# Patient Record
Sex: Male | Born: 1990 | Race: Black or African American | Hispanic: No | Marital: Single | Smoking: Never smoker
Health system: Southern US, Community
[De-identification: ages and names within clinical notes are randomized; demographics above are authoritative.]

## PROBLEM LIST (undated history)

## (undated) ENCOUNTER — Emergency Department (HOSPITAL_COMMUNITY): Admission: EM | Payer: Self-pay | Source: Home / Self Care

---
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
## 2009-01-28 IMAGING — CR DG ANKLE 2V *L*
1 series · 2 of 2 positions shown · non-contrast
Comparison: none

REASON FOR EXAM: inj  ent
COMMENTS:   LMP: (Male)

PROCEDURE:     DXR - DXR ANKLE LEFT AP AND LATERAL  - October 06, 2007  [DATE]
RESULT:     Images of the left ankle demonstrate no fracture, dislocation or
radiopaque foreign body. There does appear to be some mild soft tissue
swelling medially.

[Series 1: view not recorded · 0.17mm/px · 2 of 2 slices shown]
[im 1/2]
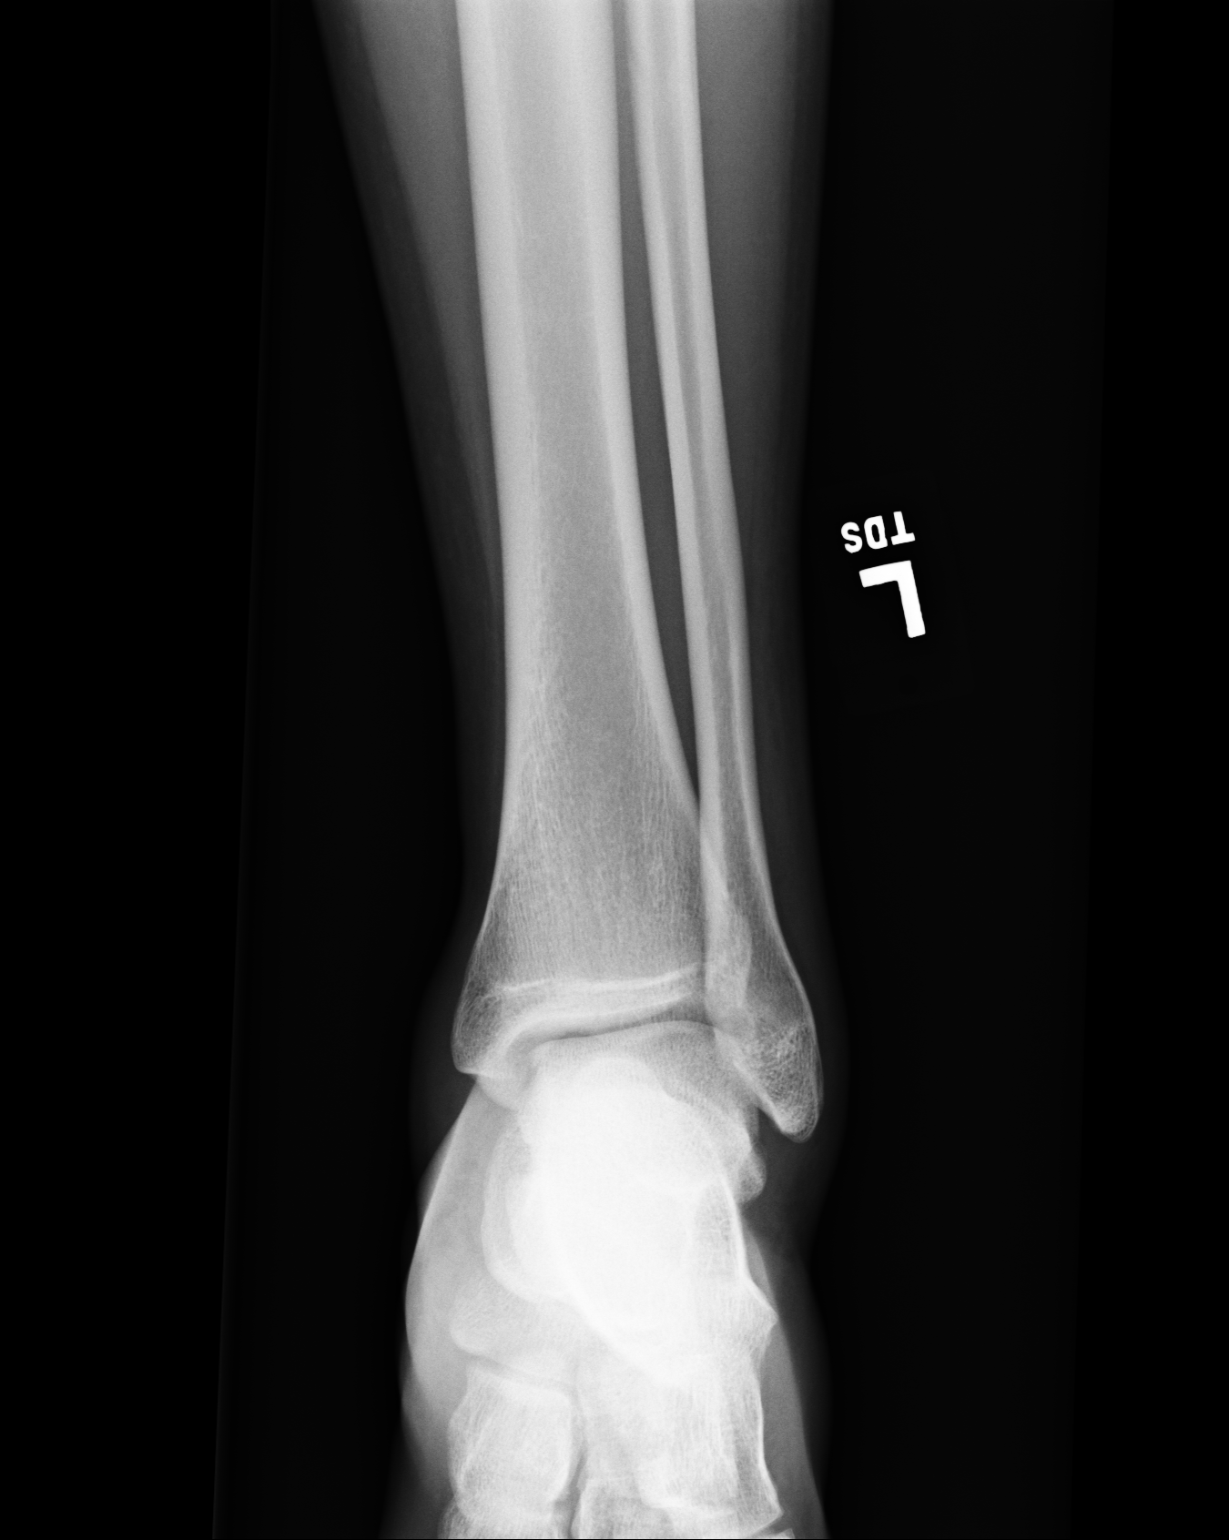
[im 2/2]
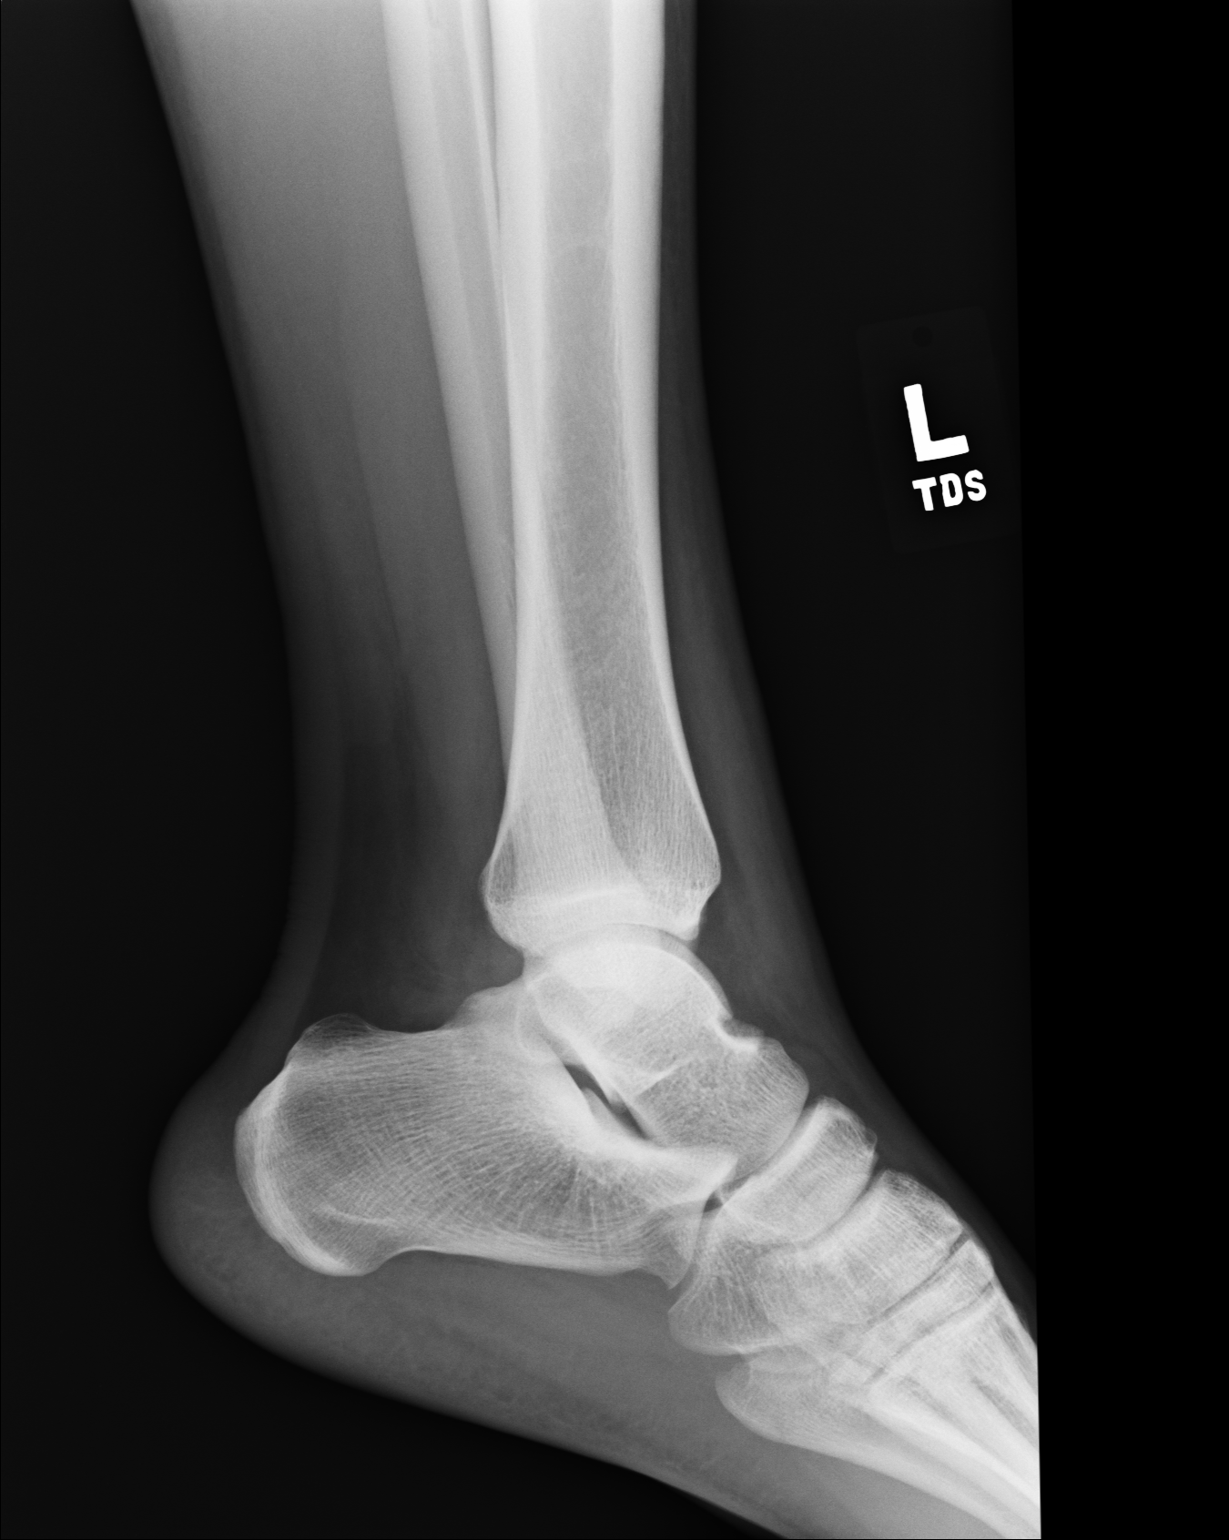

[2 of 2 positions shown; findings below may reference images not displayed]

IMPRESSION: No acute bony abnormality evident on these 2 views.

## 2009-01-28 IMAGING — CR DG FOOT 2V*L*
1 series · 2 of 2 positions shown · non-contrast
Comparison: none

REASON FOR EXAM: injury
COMMENTS:

PROCEDURE:     DXR - DXR FOOT LEFT AP AND LATERAL  - October 06, 2007  [DATE]
RESULT:     Images of the left foot demonstrate no fracture, dislocation or
radiopaque foreign body. If the patient has persistent symptoms, followup
images would be suggested in 7 to 10 days.

[Series 1: view not recorded · 0.17mm/px · 2 of 2 slices shown]
[im 1/2]
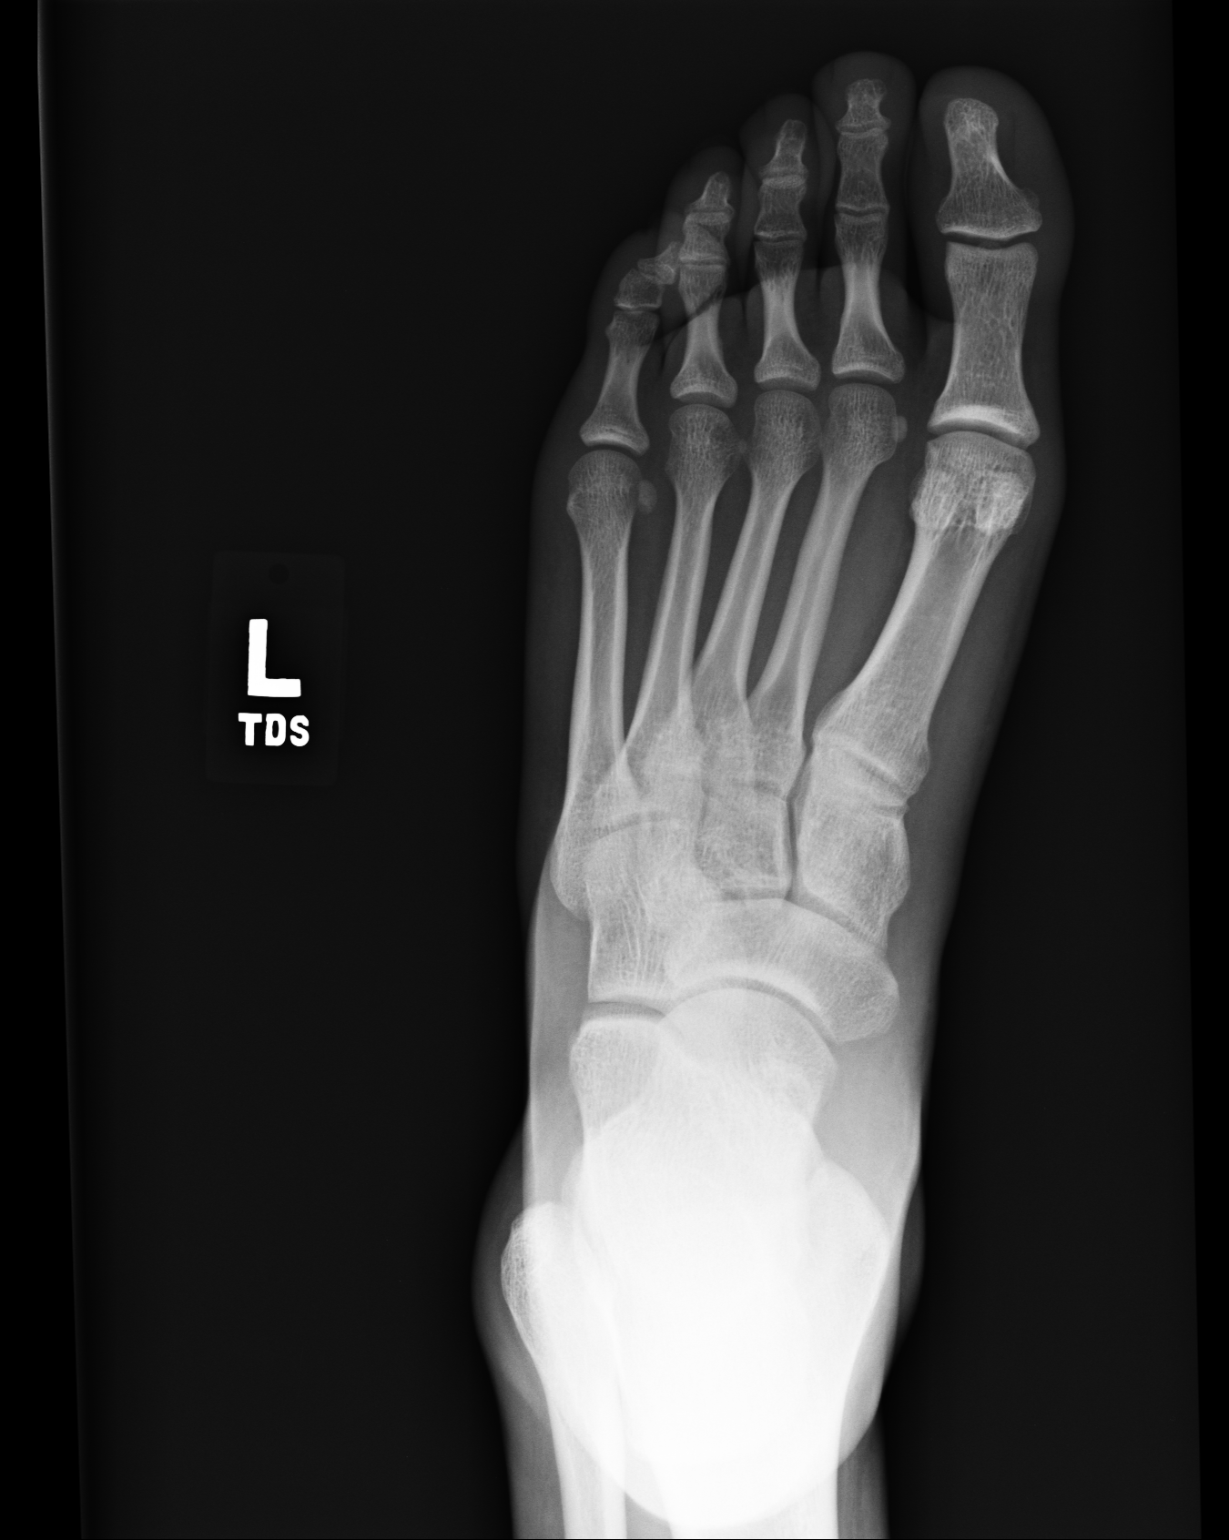
[im 2/2]
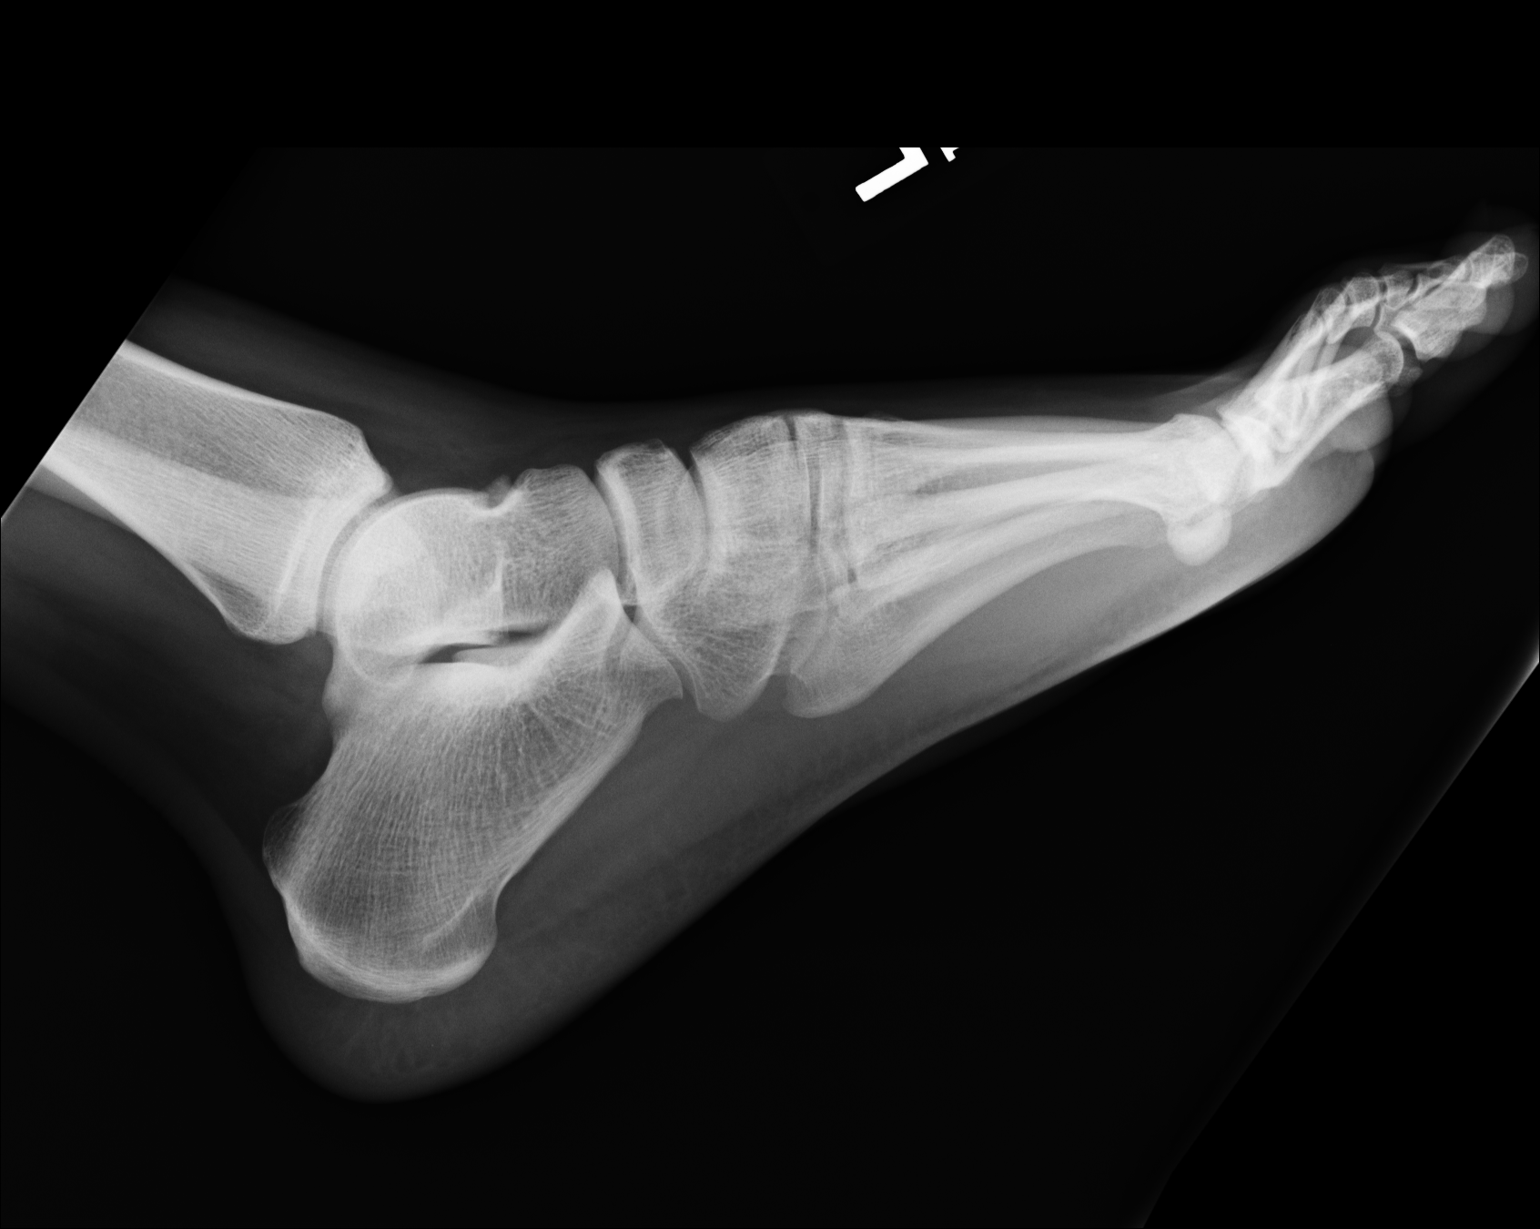

[2 of 2 positions shown; findings below may reference images not displayed]

IMPRESSION: No acute bony abnormality demonstrated.

## 2014-04-12 ENCOUNTER — Emergency Department: Payer: Self-pay | Admitting: Emergency Medicine

## 2014-04-16 ENCOUNTER — Emergency Department: Payer: Self-pay | Admitting: Internal Medicine

## 2014-04-23 ENCOUNTER — Emergency Department: Payer: Self-pay | Admitting: Emergency Medicine

## 2014-12-12 ENCOUNTER — Emergency Department: Payer: Self-pay | Admitting: Student

## 2015-08-05 IMAGING — CR RIGHT LITTLE FINGER 2+V
1 series · 3 of 3 positions shown · non-contrast
Comparison: None.

CLINICAL DATA: Post reduction.

EXAM:
RIGHT LITTLE FINGER 2+V

[Series 1: pa · 0.17mm/px · 3 of 3 slices shown]
[im 1/3]
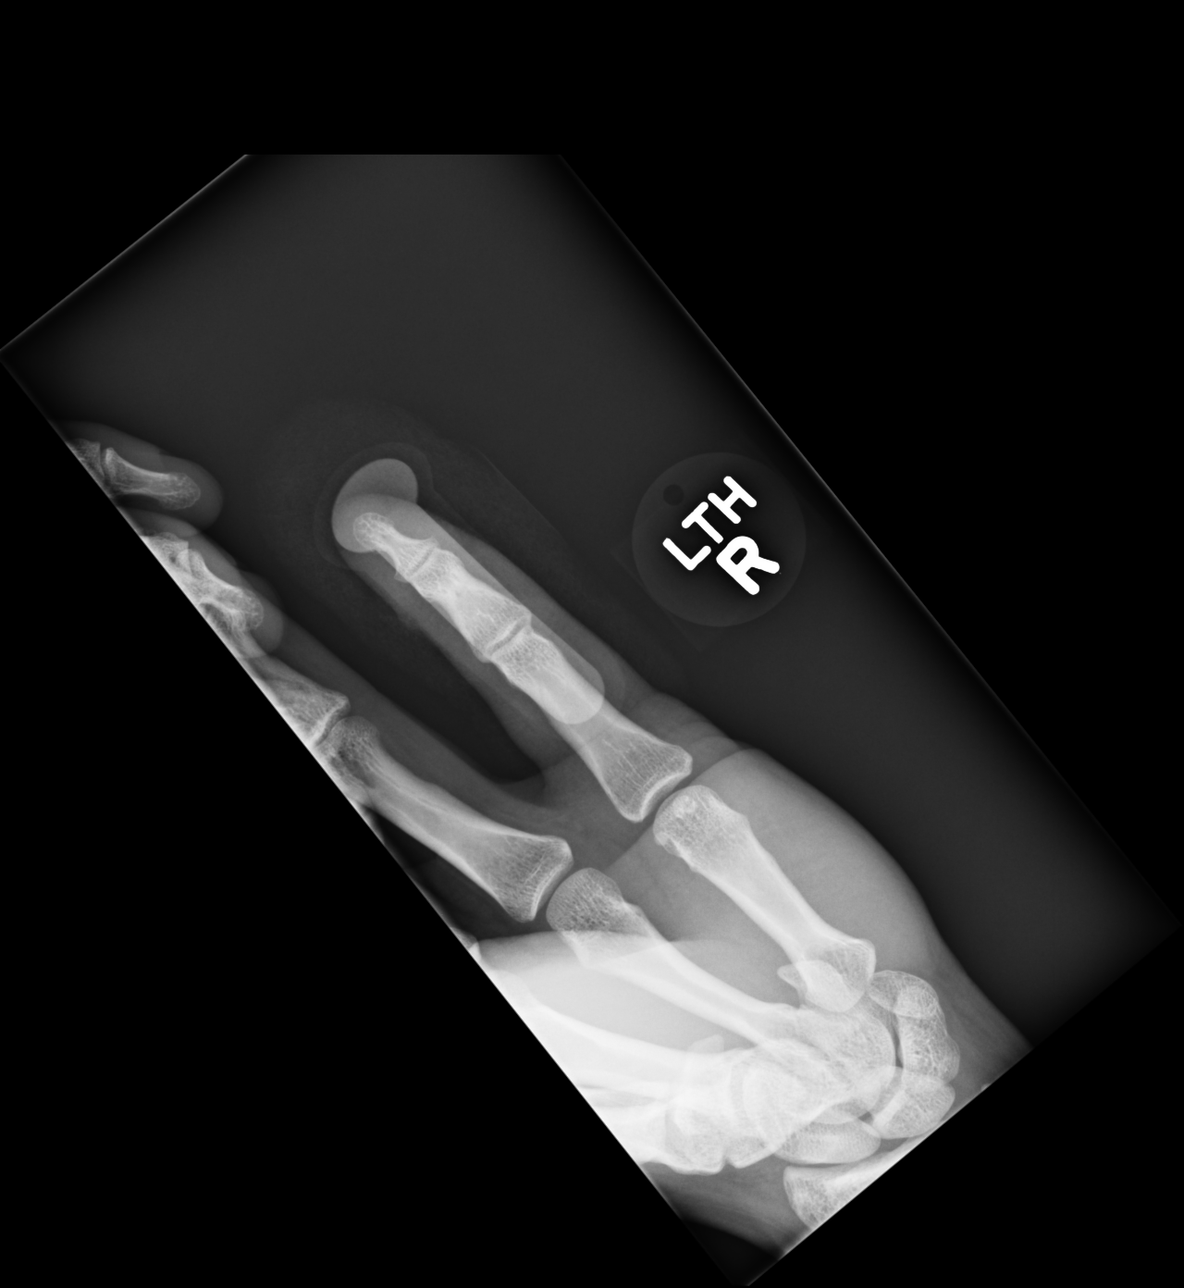
[im 2/3]
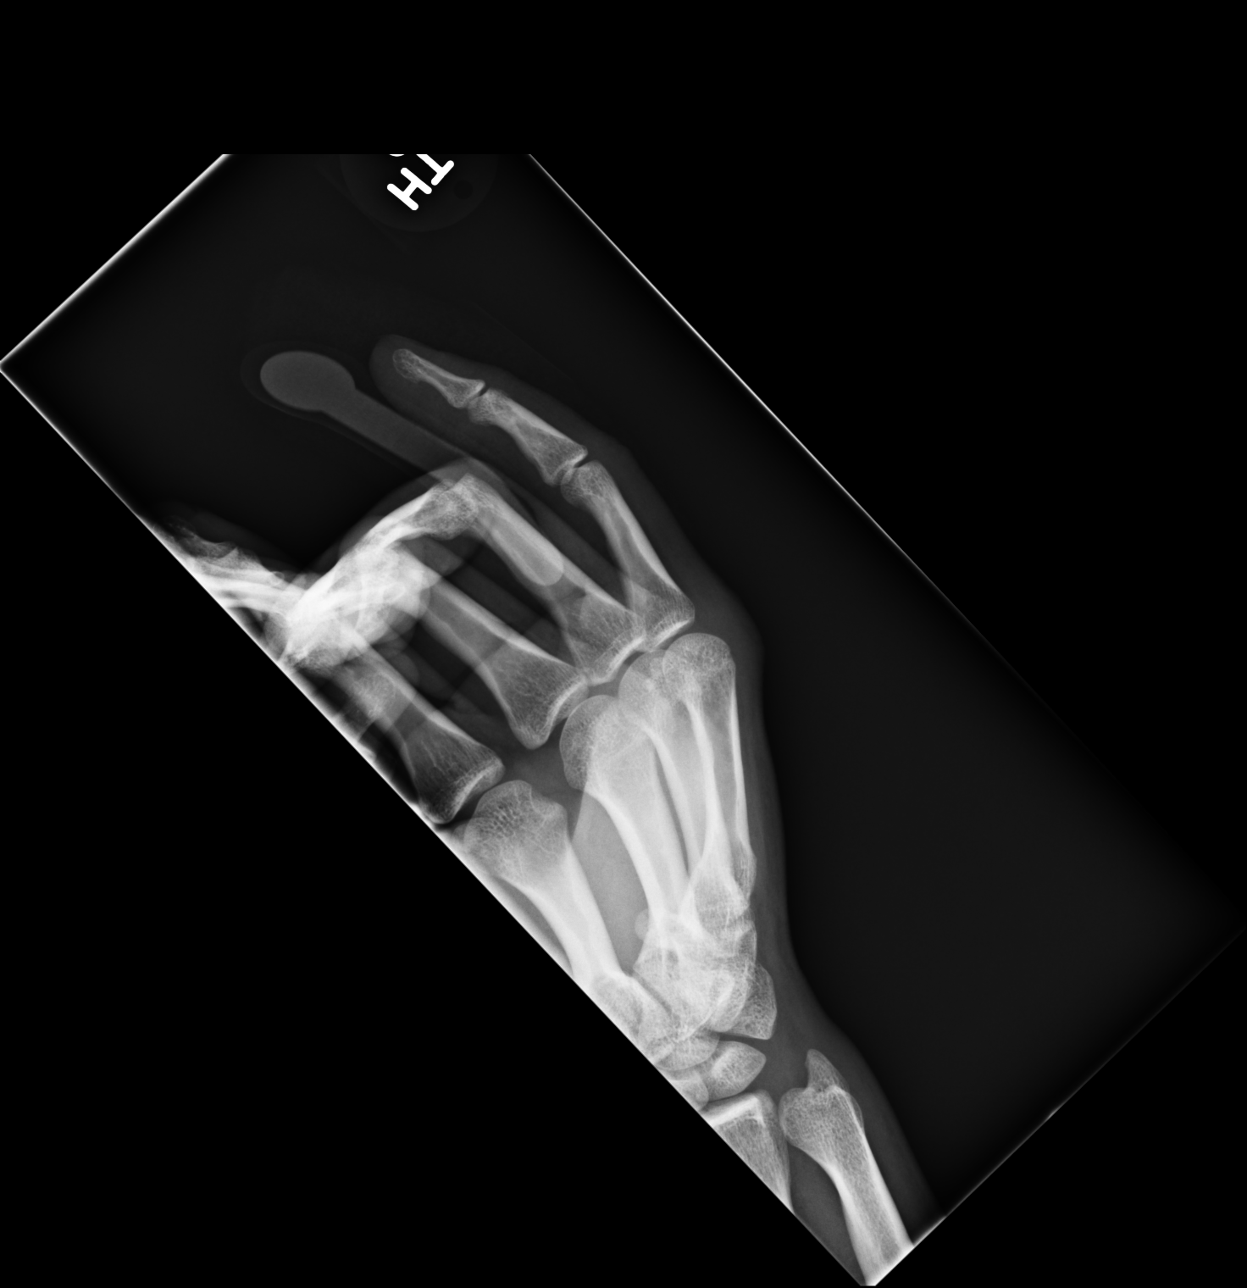
[im 3/3]
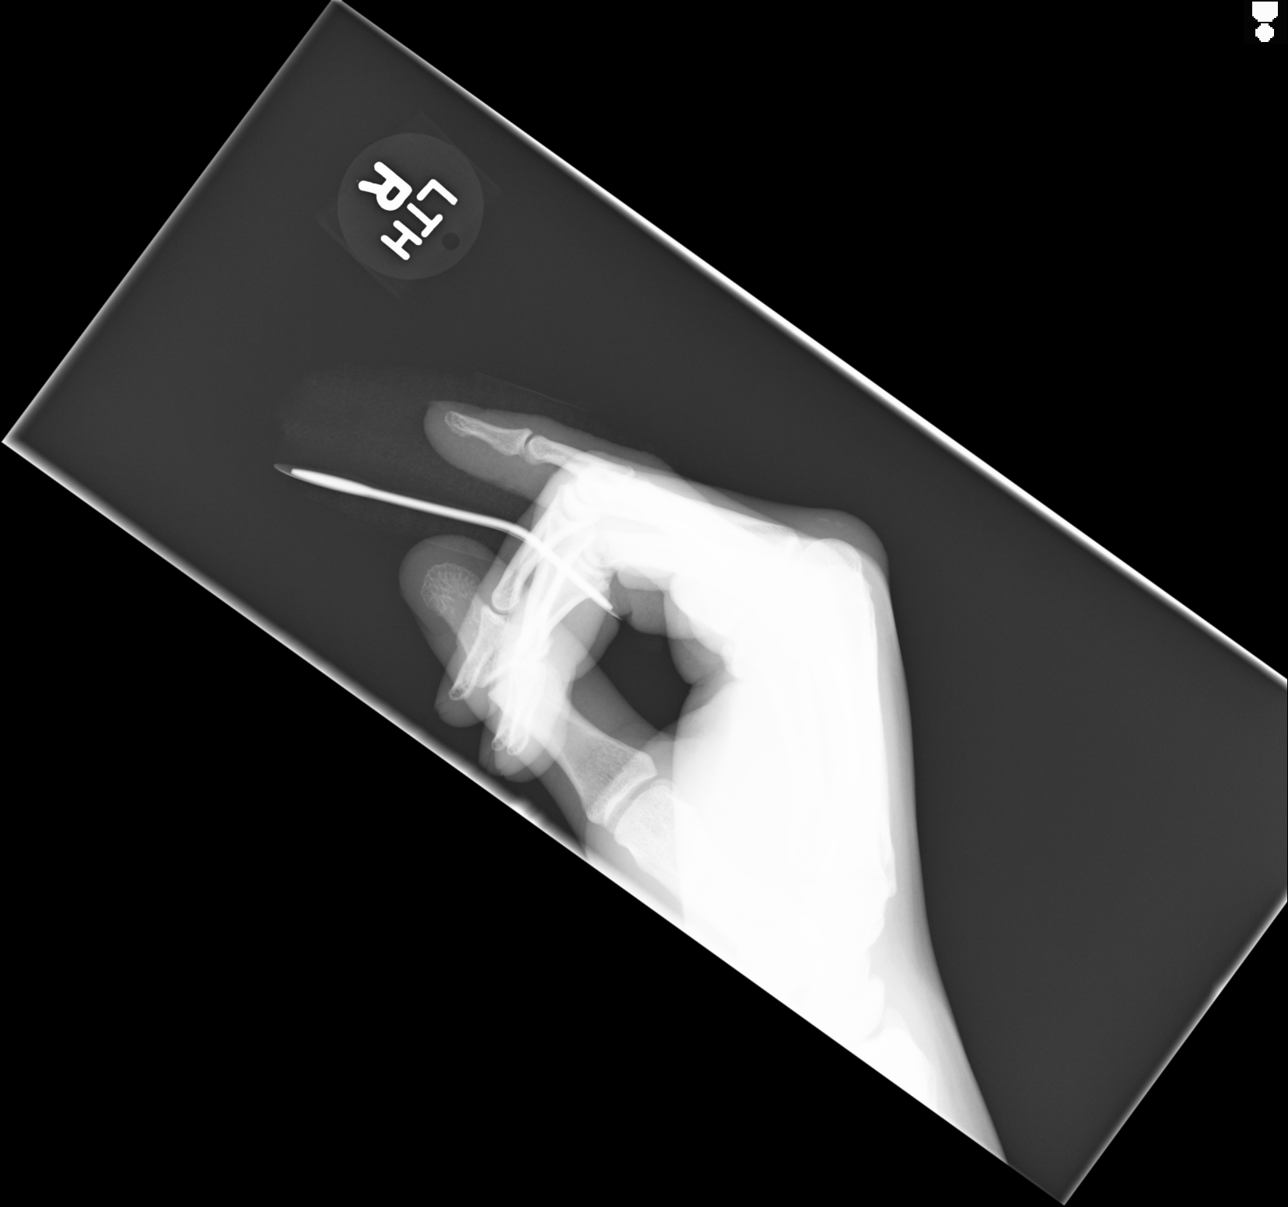

[3 of 3 positions shown; findings below may reference images not displayed]

FINDINGS: The previously identified fifth finger DIP joint dislocation has
been reduced. No visible fracture. A splint has been placed over the
volar aspect.
IMPRESSION: Satisfactory postreduction appearance.

## 2021-06-27 ENCOUNTER — Encounter: Payer: Self-pay | Admitting: Emergency Medicine

## 2021-06-27 ENCOUNTER — Ambulatory Visit
Admission: EM | Admit: 2021-06-27 | Discharge: 2021-06-27 | Disposition: A | Discharge status: Home / Self Care | Payer: Commercial Managed Care - PPO | Attending: Urgent Care | Admitting: Urgent Care

## 2021-06-27 ENCOUNTER — Other Ambulatory Visit: Payer: Self-pay

## 2021-06-27 DIAGNOSIS — H5789 Other specified disorders of eye and adnexa: Secondary | ICD-10-CM | POA: Diagnosis present

## 2021-06-27 DIAGNOSIS — H01111 Allergic dermatitis of right upper eyelid: Secondary | ICD-10-CM

## 2021-06-27 DIAGNOSIS — H01112 Allergic dermatitis of right lower eyelid: Secondary | ICD-10-CM | POA: Insufficient documentation

## 2021-06-27 DIAGNOSIS — H01115 Allergic dermatitis of left lower eyelid: Secondary | ICD-10-CM | POA: Diagnosis present

## 2021-06-27 DIAGNOSIS — H01114 Allergic dermatitis of left upper eyelid: Secondary | ICD-10-CM

## 2021-06-27 NOTE — Discharge Instructions (Signed)
Please report to Dr. Benard Rink office now.  He will see you urgently for your eyes.  The suspicion is that you have an allergic dermatitis of your eyes and need a steroid drop which she can help you with.  He will do his own evaluation and exam with you as well.

## 2021-06-27 NOTE — ED Provider Notes (Signed)
[facility]   MRN: [id number] DOB: [birth year]  Subjective:   Seth Wong is a 30 y.o. male presenting for 4-day history of persistent bilateral eye redness, watering, now having eyelid swelling bilaterally as well.  Has started to have photophobia as well.  Has gone to the ER twice in has received 2 different antibiotic eyedrops without any relief.  No sick contacts to his knowledge.  No contact lens use.  No current facility-administered medications for this encounter. No current outpatient medications on file.   No Known Allergies  History reviewed. No pertinent past medical history.   History reviewed. No pertinent surgical history.  History reviewed. No pertinent family history.  Social History   Tobacco Use   Smoking status: Never   Smokeless tobacco: Never    ROS   Objective:   Vitals: BP 133/87 (BP Location: Left Arm)   Pulse 77   Temp 98 F (36.7 C) (Oral)   Resp 16   SpO2 98%   Physical Exam Constitutional:      General: He is not in acute distress.    Appearance: Normal appearance. He is well-developed and normal weight. He is not ill-appearing, toxic-appearing or diaphoretic.  HENT:     Head: Normocephalic and atraumatic.     Right Ear: External ear normal.     Left Ear: External ear normal.     Nose: Nose normal.     Mouth/Throat:     Pharynx: Oropharynx is clear.  Eyes:     General: Lids are everted, no foreign bodies appreciated. No scleral icterus.       Right eye: Discharge present. No foreign body or hordeolum.        Left eye: Discharge present.No foreign body or hordeolum.     Extraocular Movements: Extraocular movements intact.     Conjunctiva/sclera:     Right eye: Right conjunctiva is injected. Chemosis present. No exudate or hemorrhage.    Left eye: Left conjunctiva is injected. Chemosis present. No exudate or hemorrhage.    Pupils: Pupils are equal, round, and reactive to light.     Comments: 1+ swelling of  bilateral upper eyelids, trace swelling of bilateral lower eyelids.  Cardiovascular:     Rate and Rhythm: Normal rate.  Pulmonary:     Effort: Pulmonary effort is normal.  Musculoskeletal:     Cervical back: Normal range of motion.  Neurological:     Mental Status: He is alert and oriented to person, place, and time.  Psychiatric:        Mood and Affect: Mood normal.        Behavior: Behavior normal.        Thought Content: Thought content normal.        Judgment: Judgment normal.    Eye Exam: Eyelids everted and swept for foreign body. The eye was anesthetized with 2 drops of tetracaine and stained with fluorescein. Examination under woods lamp does not reveal a foreign body or area of increased stain uptake. The eye was then irrigated copiously with saline.   Assessment and Plan :   PDMP not reviewed this encounter.  1. 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 Eye redness     Case discussed with the ophthalmologist on-call, Dr. Genia Del.  He was kind enough to see the patient in clinic now.  Discussed possibility of an allergic dermatitis and will evaluate him further, consider using steroid treatment.  Patient is alert, contracts for safety  and will report to his office now. Counseled patient on potential for adverse effects with medications prescribed/recommended today, ER and return-to-clinic precautions discussed, patient verbalized understanding.    Wallis Bamberg, PA-C 06/27/21 1057

## 2021-06-27 NOTE — ED Triage Notes (Signed)
Thursday went to ED for pink eye, given 2 medications, used them daily without improvement. Was seen again and given a stronger antibiotic, no improvement, getting worse. Patient believes it may be chlamydia in his eyes. Bilateral eyes very red, irritated, photophobic, cloudy discharge from eyes.

## 2021-06-28 LAB — CYTOLOGY, (ORAL, ANAL, URETHRAL) ANCILLARY ONLY
Chlamydia: NEGATIVE
Comment: NEGATIVE
Comment: NEGATIVE
Comment: NORMAL
Neisseria Gonorrhea: NEGATIVE
Trichomonas: NEGATIVE

## 2023-10-22 ENCOUNTER — Emergency Department (HOSPITAL_COMMUNITY): Payer: Self-pay

## 2023-10-22 ENCOUNTER — Encounter (HOSPITAL_COMMUNITY): Payer: Self-pay | Admitting: Emergency Medicine

## 2023-10-22 ENCOUNTER — Other Ambulatory Visit: Payer: Self-pay

## 2023-10-22 ENCOUNTER — Emergency Department (HOSPITAL_COMMUNITY)
Admission: EM | Admit: 2023-10-22 | Discharge: 2023-10-23 | Disposition: A | Discharge status: Home / Self Care | Payer: Self-pay | Attending: Emergency Medicine | Admitting: Emergency Medicine

## 2023-10-22 DIAGNOSIS — M7989 Other specified soft tissue disorders: Secondary | ICD-10-CM | POA: Insufficient documentation

## 2023-10-22 DIAGNOSIS — W230XXA Caught, crushed, jammed, or pinched between moving objects, initial encounter: Secondary | ICD-10-CM | POA: Insufficient documentation

## 2023-10-22 DIAGNOSIS — Y9389 Activity, other specified: Secondary | ICD-10-CM | POA: Insufficient documentation

## 2023-10-22 DIAGNOSIS — S62316A Displaced fracture of base of fifth metacarpal bone, right hand, initial encounter for closed fracture: Secondary | ICD-10-CM | POA: Insufficient documentation

## 2023-10-22 NOTE — ED Triage Notes (Signed)
Pt here for R hand pain, states a metal door was slammed on his hand. Swelling noted to R hand and knuckles.

## 2023-10-23 MED ORDER — IBUPROFEN 800 MG PO TABS: 800.0000 mg | ORAL_TABLET | Freq: Three times a day (TID) | ORAL | 0 refills | Status: AC

## 2023-10-23 MED ORDER — IBUPROFEN 800 MG PO TABS
800.0000 mg | ORAL_TABLET | Freq: Once | ORAL | Status: AC
  Administered 2023-10-23: 800 mg via ORAL
  Filled 2023-10-23: qty 1

## 2023-10-23 NOTE — ED Provider Notes (Signed)
[facility] Emergency Department Provider Note MRN:  [id number]  Arrival date & time: 10/23/23     Chief Complaint   Hand Injury   History of Present Illness   Seth Wong is a 33 y.o. year-old male presents to the ED with chief complaint of right hand injury.  He states that he was horse playing with his younger brother and was chasing him through the house and a door got slammed on his right hand.  He states that the mechanism was similar to punching impact on the door.  He denies any other injuries.  Denies any treatments prior to arrival.  Reports associated swelling of the hand and pain and tenderness with palpation and movement.  History provided by patient.   Review of Systems  Pertinent positive and negative review of systems noted in HPI.    Physical Exam   Vitals:   10/22/23 2240 10/23/23 0250  BP: (!) 115/91 (!) 140/100  Pulse: 80 100  Resp: 16 18  Temp: 98.3 F (36.8 C) 98.2 F (36.8 C)  SpO2: 99% 100%    CONSTITUTIONAL:  well-appearing, NAD NEURO:  Alert and oriented x 3, CN 3-12 grossly intact EYES:  eyes equal and reactive ENT/NECK:  Supple, no stridor  CARDIO:  normal rate, appears well-perfused  PULM:  No respiratory distress,  GI/GU:  non-distended,  MSK/SPINE:  Moderate swelling of the right hand, intact distal pulses, TTP over the 5th metacarpal SKIN:  no rash, atraumatic   *Additional and/or pertinent findings included in MDM below  Diagnostic and Interventional Summary    EKG Interpretation Date/Time:    Ventricular Rate:    PR Interval:    QRS Duration:    QT Interval:    QTC Calculation:   R Axis:      Text Interpretation:         Labs Reviewed - No data to display  DG Hand Complete Right  Final Result    DG Wrist Complete Right  Final Result      Medications  ibuprofen (ADVIL) tablet 800 mg (800 mg Oral Given 10/23/23 0227)     Procedures  /  Critical Care Procedures  ED Course and Medical  Decision Making  I have reviewed the triage vital signs, the nursing notes, and pertinent available records from the EMR.  Social Determinants Affecting Complexity of Care: Patient has no clinically significant social determinants affecting this chief complaint..   ED Course:    Medical Decision Making Amount and/or Complexity of Data Reviewed Radiology: ordered and independent interpretation performed.    Details: Fracture of the base of the 5th metacarpal  Risk Prescription drug management.         Consultants: No consultations were needed in caring for this patient.   Treatment and Plan: Emergency department workup does not suggest an emergent condition requiring admission or immediate intervention beyond  what has been performed at this time. The patient is safe for discharge and has  been instructed to return immediately for worsening symptoms, change in  symptoms or any other concerns    Final Clinical Impressions(s) / ED Diagnoses     ICD-10-CM   1. Closed displaced fracture of base of fifth metacarpal bone of right hand, initial encounter  S62.316A       ED Discharge Orders          Ordered    ibuprofen (ADVIL) 800 MG tablet  3 times daily  10/23/23 0252              Discharge Instructions Discussed with and Provided to Patient:   Discharge Instructions   None      Roxy Horseman, PA-C 10/23/23 0301    Gilda Crease, MD 10/23/23 [phone]

## 2023-10-23 NOTE — Progress Notes (Signed)
Orthopedic Tech Progress Note Patient Details:  Seth Wong [facility] 30-Sep-1991 [id number]  Ortho Devices Type of Ortho Device: Ulna gutter splint, Sling immobilizer Ortho Device/Splint Location: RUE Ortho Device/Splint Interventions: Ordered, Application, Adjustment   Post Interventions Patient Tolerated: Well Instructions Provided: Care of device  [name] 10/23/2023, 2:52 AM
